# Patient Record
Sex: Female | Born: 2000 | Race: Black or African American | Hispanic: No | Marital: Single | Smoking: Never smoker
Health system: Southern US, Community
[De-identification: ages and names within clinical notes are randomized; demographics above are authoritative.]

---
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
## 2022-02-03 ENCOUNTER — Ambulatory Visit: Payer: Self-pay

## 2022-02-09 ENCOUNTER — Encounter: Payer: Self-pay | Admitting: Nurse Practitioner

## 2022-02-09 ENCOUNTER — Ambulatory Visit: Payer: Self-pay | Admitting: Nurse Practitioner

## 2022-02-09 ENCOUNTER — Ambulatory Visit (LOCAL_COMMUNITY_HEALTH_CENTER): Payer: Self-pay | Admitting: Nurse Practitioner

## 2022-02-09 VITALS — BP 141/94 | Ht 65.0 in | Wt 125.4 lb

## 2022-02-09 DIAGNOSIS — Z113 Encounter for screening for infections with a predominantly sexual mode of transmission: Secondary | ICD-10-CM

## 2022-02-09 DIAGNOSIS — Z01419 Encounter for gynecological examination (general) (routine) without abnormal findings: Secondary | ICD-10-CM

## 2022-02-09 DIAGNOSIS — Z30011 Encounter for initial prescription of contraceptive pills: Secondary | ICD-10-CM

## 2022-02-09 DIAGNOSIS — Z3009 Encounter for other general counseling and advice on contraception: Secondary | ICD-10-CM

## 2022-02-09 LAB — WET PREP FOR TRICH, YEAST, CLUE
Trichomonas Exam: NEGATIVE
Yeast Exam: NEGATIVE

## 2022-02-09 LAB — HM HIV SCREENING LAB: HM HIV Screening: NEGATIVE

## 2022-02-09 LAB — HM HEPATITIS C SCREENING LAB: HM Hepatitis Screen: NEGATIVE

## 2022-02-09 MED ORDER — LEVONORGESTREL 1.5 MG PO TABS: 1.5000 mg | ORAL_TABLET | Freq: Once | ORAL | 0 refills | Status: AC

## 2022-02-09 MED ORDER — NORGESTIM-ETH ESTRAD TRIPHASIC 0.18/0.215/0.25 MG-35 MCG PO TABS: 1.0000 | ORAL_TABLET | Freq: Every day | ORAL | 0 refills | Status: AC

## 2022-02-09 NOTE — Progress Notes (Signed)
Pt here for STD screening.  Wet mount reviewed, no treatment required per Provider.  Condoms given.  Berdie Ogren, RN

## 2022-02-09 NOTE — Progress Notes (Signed)
Pt here for PE and BC.  The patient was dispensed Tri Sprintec #3 packs and Plan B today. I provided counseling today regarding the medication. We discussed the medication, the side effects and when to call clinic. Patient given the opportunity to ask questions. Questions answered.  FP packet given.  Berdie Ogren, RN

## 2022-02-09 NOTE — Progress Notes (Signed)
[facility] Department  STI clinic/screening visit [place] [id number] [phone]  Subjective:  Alicia Ochoa is a 21 y.o. female being seen today for an STI screening visit. The patient reports they do not have symptoms.  Patient reports that they do not desire a pregnancy in the next year.   They reported they are interested in discussing contraception today.    Patient's last menstrual period was 01/13/2022 (exact date).   Patient has the following medical conditions:  There are no problems to display for this patient.   Chief Complaint  Patient presents with   SEXUALLY TRANSMITTED DISEASE    Screening- patient stated she is having some vaginal odor     HPI  Patient reports to clinic today for and STD screening.  Patient reports that she is asymptomatic.     Last HIV test per patient/review of record was: never Patient reports last pap was: never   Screening for MPX risk: Does the patient have an unexplained rash? No Is the patient MSM? No Does the patient endorse multiple sex partners or anonymous sex partners? No Did the patient have close or sexual contact with a person diagnosed with MPX? No Has the patient traveled outside the [place] where MPX is endemic? No Is there a high clinical suspicion for MPX-- evidenced by one of the following No  -Unlikely to be chickenpox  -Lymphadenopathy  -Rash that present in same phase of evolution on any given body part See flowsheet for further details and programmatic requirements.   Immunization history:  Immunization History  Administered Date(s) Administered   HPV 9-valent 01/20/2015   Hepatitis A 03/01/2007, 01/04/2011   Hepatitis B 2002/08/05, 08/07/2002, 01/17/2003   MMR 09/01/2003, 09/20/2007   Meningococcal Conjugate 01/20/2015   Meningococcal Mcv4o 03/27/2020, 03/27/2020   Pneumococcal Conjugate PCV 7 10/04/2002, 11/12/2002, 09/01/2003   Tdap 01/20/2015   Varicella 09/01/2003,  03/01/2007, 09/20/2007     The following portions of the patient's history were reviewed and updated as appropriate: allergies, current medications, past medical history, past social history, past surgical history and problem list.  Objective:  There were no vitals filed for this visit.  Physical Exam Constitutional:      Appearance: Normal appearance.  HENT:     Head: Normocephalic. No abrasion, masses or laceration. Hair is normal.     Right Ear: External ear normal.     Left Ear: External ear normal.     Nose: Nose normal.     Mouth/Throat:     Lips: Pink.     Mouth: Mucous membranes are moist. No oral lesions.     Pharynx: No oropharyngeal exudate or posterior oropharyngeal erythema.     Tonsils: No tonsillar exudate or tonsillar abscesses.     Comments: No visible signs of dental caries  Eyes:     General: Lids are normal.        Right eye: No discharge.        Left eye: No discharge.     Conjunctiva/sclera: Conjunctivae normal.     Right eye: No exudate.    Left eye: No exudate. Abdominal:     General: Abdomen is flat.     Palpations: Abdomen is soft.     Tenderness: There is no abdominal tenderness. There is no rebound.  Genitourinary:    Pubic Area: No rash or pubic lice.      Labia:        Right: No rash, tenderness, lesion or  injury.        Left: No rash, tenderness, lesion or injury.      Vagina: Normal. No vaginal discharge, erythema or lesions.     Cervix: No cervical motion tenderness, discharge, lesion or erythema.     Uterus: Not enlarged and not tender.      Rectum: Normal.     Comments: Amount Discharge: small  Odor: yes  pH: less than 4.5 Adheres to vaginal wall: No Color:color of discharge matches the Alicia Ochoa swab   Musculoskeletal:     Cervical back: Full passive range of motion without pain, normal range of motion and neck supple.  Lymphadenopathy:     Cervical: No cervical adenopathy.     Right cervical: No superficial, deep or posterior  cervical adenopathy.    Left cervical: No superficial, deep or posterior cervical adenopathy.     Upper Body:     Right upper body: No supraclavicular, axillary or epitrochlear adenopathy.     Left upper body: No supraclavicular, axillary or epitrochlear adenopathy.     Lower Body: No right inguinal adenopathy. No left inguinal adenopathy.  Skin:    General: Skin is warm and dry.     Findings: No lesion or rash.  Neurological:     Mental Status: She is alert and oriented to person, place, and time.  Psychiatric:        Attention and Perception: Attention normal.        Mood and Affect: Mood normal.        Speech: Speech normal.        Behavior: Behavior normal. Behavior is cooperative.      Assessment and Plan:  Alicia Ochoa is a 21 y.o. female presenting to the [facility] Department for STI screening  1. Screening examination for venereal disease -21 year old female in clinic today for STD screening. -Patient accepted all screenings including oral GC, vaginal CT/GC, wet prep and bloodwork for HIV/RPR.  Patient meets criteria for HepB screening? No. Ordered? No - low risk  Patient meets criteria for HepC screening? Yes. Ordered? Yes  Treat wet prep per standing order Discussed time line for [facility] Lab results and that patient will be called with positive results and encouraged patient to call if she had not heard in 2 weeks.  Counseled to return or seek care for continued or worsening symptoms Recommended condom use with all sex  Patient is currently not using  contraception  to prevent pregnancy.    - HIV/HCV [facility] - Syphilis Serology, [place] [facility] - Chlamydia/Gonorrhea [place] [facility] - WET PREP FOR [place], YEAST, CLUE - Gonococcus culture   Total time spent: 30 minutes   Return if symptoms worsen or fail to improve.    Gregary Cromer, FNP

## 2022-02-09 NOTE — Progress Notes (Signed)
[facility] DEPARTMENT [facility] [address] Main Number: [phone]    Family Planning Visit- Initial Visit  Subjective:  Alicia Ochoa is a 21 y.o.  [id number]   being seen today for an initial annual visit and to discuss reproductive life planning.  The patient is currently using No Method - Other Reason for pregnancy prevention. Patient reports   does not want a pregnancy in the next year.     report they are looking for a method that provides Method they can control starting/stopping  Patient has the following medical conditions does not have a problem list on file.  Chief Complaint  Patient presents with   Contraception    PE and BC    Patient reports to clinic today for a physical, STD screening, and to discuss birth control.     Body mass index is 20.87 kg/m. - Patient is eligible for diabetes screening based on BMI and age >60?  not applicable HA1C ordered? not applicable  Patient reports 2  partner/s in last year. Desires STI screening?  Yes  Has patient been screened once for HCV in the past?  No  No results found for: "HCVAB"  Does the patient have current drug use (including MJ), have a partner with drug use, and/or has been incarcerated since last result? Yes  If yes-- Screen for HCV through [facility]   Does the patient meet criteria for HBV testing? Yes  Criteria:  -Household, sexual or needle sharing contact with HBV -History of drug use -HIV positive -Those with known Hep C   Health Maintenance Due  Topic Date Due   HPV VACCINES (2 - 2-dose series) 07/23/2015   HIV Screening  Never done   Hepatitis C Screening  Never done   CHLAMYDIA SCREENING  05/25/2021   INFLUENZA VACCINE  Never done    Review of Systems  Constitutional:  Negative for chills, fever, malaise/fatigue and weight loss.  HENT:  Negative for congestion, hearing loss and sore throat.   Eyes:  Negative for blurred vision, double vision  and photophobia.  Respiratory:  Negative for shortness of breath.   Cardiovascular:  Negative for chest pain.  Gastrointestinal:  Negative for abdominal pain, blood in stool, constipation, diarrhea, heartburn, nausea and vomiting.  Genitourinary:  Negative for dysuria and frequency.  Musculoskeletal:  Negative for back pain, joint pain and neck pain.  Skin:  Negative for itching and rash.  Neurological:  Negative for dizziness, weakness and headaches.  Endo/Heme/Allergies:  Does not bruise/bleed easily.  Psychiatric/Behavioral:  Negative for depression, substance abuse and suicidal ideas.     The following portions of the patient's history were reviewed and updated as appropriate: allergies, current medications, past family history, past medical history, past social history, past surgical history and problem list. Problem list updated.   See flowsheet for other program required questions.  Objective:   Vitals:   02/09/22 1645 02/09/22 1650 02/09/22 1657  BP: (!) 157/99 (!) 149/97 (!) 141/94  Weight: 125 lb 6.4 oz (56.9 kg)    Height: 5\' 5"  (1.651 m)      Physical Exam Constitutional:      Appearance: Normal appearance.  HENT:     Head: Normocephalic. No abrasion, masses or laceration. Hair is normal.     Jaw: No tenderness or swelling.     Right Ear: External ear normal.     Left Ear: External ear normal.     Nose: Nose normal.  Mouth/Throat:     Lips: Pink. No lesions.     Mouth: Mucous membranes are moist. No lacerations or oral lesions.     Dentition: No dental caries.     Tongue: No lesions.     Palate: No mass and lesions.     Pharynx: No pharyngeal swelling, oropharyngeal exudate, posterior oropharyngeal erythema or uvula swelling.     Tonsils: No tonsillar exudate or tonsillar abscesses.     Comments: Dental caries noted  Eyes:     Pupils: Pupils are equal, round, and reactive to light.  Neck:     Thyroid: No thyroid mass, thyromegaly or thyroid tenderness.   Cardiovascular:     Rate and Rhythm: Normal rate and regular rhythm.  Pulmonary:     Effort: Pulmonary effort is normal.     Breath sounds: Normal breath sounds.  Abdominal:     General: Abdomen is flat. Bowel sounds are normal.     Palpations: Abdomen is soft.     Tenderness: There is no abdominal tenderness. There is no rebound.  Genitourinary:    Pubic Area: No rash or pubic lice.      Labia:        Right: No rash, tenderness or lesion.        Left: No rash, tenderness or lesion.      Vagina: Normal. No vaginal discharge, erythema, tenderness or lesions.     Cervix: No cervical motion tenderness, discharge, lesion or erythema.     Uterus: Normal.      Adnexa:        Right: No tenderness.         Left: No tenderness.       Rectum: Normal.     Comments: Amount Discharge: small  Odor: Yes pH: less than 4.5 Adheres to vaginal wall: No Color: color of discharge matches the Zuriyah Shatz swab  Musculoskeletal:     Cervical back: Full passive range of motion without pain and normal range of motion.  Lymphadenopathy:     Cervical: No cervical adenopathy.     Right cervical: No superficial, deep or posterior cervical adenopathy.    Left cervical: No superficial, deep or posterior cervical adenopathy.     Upper Body:     Right upper body: No epitrochlear adenopathy.     Left upper body: No epitrochlear adenopathy.     Lower Body: No right inguinal adenopathy. No left inguinal adenopathy.  Skin:    General: Skin is warm and dry.     Findings: No erythema, laceration, lesion or rash.  Neurological:     Mental Status: She is alert and oriented to person, place, and time.  Psychiatric:        Attention and Perception: Attention normal.        Mood and Affect: Mood normal.        Speech: Speech normal.        Behavior: Behavior normal. Behavior is cooperative.       Assessment and Plan:  Alicia Ochoa is a 21 y.o. female presenting to the [facility] Department for an  initial annual wellness/contraceptive visit  Contraception counseling: Reviewed options based on patient desire and reproductive life plan. Patient is interested in Oral Contraceptive. This was provided to the patient today.   Risks, benefits, and typical effectiveness rates were reviewed.  Questions were answered.  Written information was also given to the patient to review.    The patient will follow up in  1  years for surveillance.  The patient was told to call with any further questions, or with any concerns about this method of contraception.  Emphasized use of condoms 100% of the time for STI prevention.  Need for ECP was assessed. ECP given to patient.  1. Family planning counseling -21 year old female in clinic for a physical, STD screening, and to discuss birth control. -ROS reviewed, no complaints noted.  -Patient states she is strongly considering an IUD but would like to think about it.  Agrees to taking pills until she decides.   -Ok to given Tri-Sprintec 1 PO daily #3 Packs.  Patient to report back to clinic if she desires more pills or to continue with current method. -Plan B given to patient today.  -STD screening today.  See STD note.   - Norgestimate-Ethinyl Estradiol Triphasic (TRI-SPRINTEC) 0.18/0.215/0.25 MG-35 MCG tablet; Take 1 tablet by mouth daily.  Dispense: 28 tablet; Refill: 0 - levonorgestrel (PLAN B ONE-STEP) 1.5 MG tablet; Take 1 tablet (1.5 mg total) by mouth once for 1 dose.  Dispense: 1 tablet; Refill: 0  2. Well woman exam with routine gynecological exam -Normal well woman exam. -CBE at age 89 -PAP at age 76   Total time spent: 30 minutes   Return in about 1 year (around 02/10/2023) for Annual well-woman exam.   Glenna Fellows, FNP

## 2022-02-13 LAB — GONOCOCCUS CULTURE

## 2023-05-10 ENCOUNTER — Emergency Department
Admission: EM | Admit: 2023-05-10 | Discharge: 2023-05-10 | Disposition: A | Discharge status: Home / Self Care | Payer: Medicaid Other | Attending: Emergency Medicine | Admitting: Emergency Medicine

## 2023-05-10 ENCOUNTER — Other Ambulatory Visit: Payer: Self-pay

## 2023-05-10 DIAGNOSIS — I10 Essential (primary) hypertension: Secondary | ICD-10-CM | POA: Diagnosis present

## 2023-05-10 MED ORDER — HYDROCHLOROTHIAZIDE 12.5 MG PO TABS: 12.5000 mg | ORAL_TABLET | Freq: Every day | ORAL | 1 refills | Status: AC

## 2023-05-10 NOTE — Discharge Instructions (Addendum)
If the medication is helping, you will eventually need another provider to continue prescribing it for you.

## 2023-05-10 NOTE — ED Triage Notes (Signed)
Pt to ed from home via POV for medical clearance for BP assessment to go into the [employer] [facility] [employer]. Pt is caox4, in no acute distress and ambulatory in triage. Pt staes she has already signed forms to go but her BP was too high so they sent her here. Pt has a PCP but they refused to see her right away for this need.

## 2023-05-10 NOTE — ED Provider Notes (Signed)
[facility] Provider Note    Event Date/Time   First MD Initiated Contact with Patient 05/10/23 1324     (approximate)   History   Medical Clearance   HPI Alicia Ochoa is a 22 y.o. female presenting today for medical clearance.  Patient states she is joining [employer] but they have noted her to have high blood pressure and wanted her to come get evaluated to see if she needed to be on blood pressure medication.  She states that it has been checked several times over the past couple weeks consistently being 150-160 systolic.  Has had headaches but none recently.  Denies current vision changes, chest pain, shortness of breath.  Family history of high blood pressure at a young age.     Physical Exam   Triage Vital Signs: ED Triage Vitals [05/10/23 1307]  Encounter Vitals Group     BP (!) 164/102     Systolic BP Percentile      Diastolic BP Percentile      Pulse Rate 95     Resp 16     Temp 98.7 F (37.1 C)     Temp Source Oral     SpO2 98 %     Weight      Height      Head Circumference      Peak Flow      Pain Score 0     Pain Loc      Pain Education      Exclude from Growth Chart     Most recent vital signs: Vitals:   05/10/23 1307  BP: (!) 164/102  Pulse: 95  Resp: 16  Temp: 98.7 F (37.1 C)  SpO2: 98%   I have reviewed the vital signs. General:  Awake, alert, no acute distress. Head:  Normocephalic, Atraumatic. EENT:  PERRL, EOMI, Oral mucosa pink and moist, Neck is supple. Cardiovascular: Regular rate, 2+ distal pulses. Respiratory:  Normal respiratory effort, symmetrical expansion, no distress.   Extremities:  Moving all four extremities through full ROM without pain.   Neuro:  Alert and oriented.  Interacting appropriately.   Skin:  Warm, dry, no rash.   Psych: Appropriate affect.    ED Results / Procedures / Treatments   Labs (all labs ordered are listed, but only abnormal results are displayed) Labs Reviewed - No  data to display   EKG    RADIOLOGY    PROCEDURES:  Critical Care performed: No  Procedures   MEDICATIONS ORDERED IN ED: Medications - No data to display   IMPRESSION / MDM / ASSESSMENT AND PLAN / ED COURSE  I reviewed the triage vital signs and the nursing notes.                              Differential diagnosis includes, but is not limited to, asymptomatic hypertension  Patient's presentation is most consistent with acute, uncomplicated illness.  Patient is a 22 year old female presenting today for asymptomatic high blood pressure and medical clearance.  She brings in blood pressure readings at home with most systolic blood pressures between 150-160.  No other acute symptoms present at this time.  Given, multiple readings over the past week we will start her on blood pressure medication for further control and have [facility] with [facility] [employer] for repeat blood pressure checks to ensure improvement.  Patient agreeable to this plan and given strict return precautions.  FINAL CLINICAL IMPRESSION(S) / ED DIAGNOSES   Final diagnoses:  Primary hypertension     Rx / DC Orders   ED Discharge Orders          Ordered    hydrochlorothiazide (HYDRODIURIL) 12.5 MG tablet  Daily        05/10/23 1344             Note:  This document was prepared using Dragon voice recognition software and may include unintentional dictation errors.   Janith Lima, MD 05/10/23 1344

## 2023-05-10 NOTE — ED Triage Notes (Signed)
First nurse note: Pt to ED via POV. Pt here with paperwork from [employer] and needs to be signed by MD for BP check and reports of HA. Pt ambulatory to triage in NAD.

## 2023-05-10 NOTE — ED Notes (Signed)
See triage notes. Patient here for BP for clearance before entering the [employer] [facility] [employer]

## 2023-05-25 ENCOUNTER — Emergency Department
Admission: EM | Admit: 2023-05-25 | Discharge: 2023-05-25 | Disposition: A | Discharge status: Home / Self Care | Payer: Medicaid Other | Attending: Emergency Medicine | Admitting: Emergency Medicine

## 2023-05-25 ENCOUNTER — Other Ambulatory Visit: Payer: Self-pay

## 2023-05-25 DIAGNOSIS — I10 Essential (primary) hypertension: Secondary | ICD-10-CM | POA: Diagnosis present

## 2023-05-25 NOTE — ED Triage Notes (Signed)
Pt here needing a blood pressure check for paperwork for the [employer]. Pt denies any other symptoms.

## 2023-05-25 NOTE — ED Provider Notes (Signed)
[facility] Provider Note    Event Date/Time   First MD Initiated Contact with Patient 05/25/23 1351     (approximate)   History   Follow-up   HPI  Alicia Ochoa is a 22 y.o. female who presents today for evaluation of her vital signs.  Patient reports that she is joining [employer] and they wanted her to come get her blood pressure checked as they noticed that it was elevated.  She reports that she was started on antihypertensives during her previous visit to the emergency department and feels well on this.  She denies any headache, visual changes, chest pain, shortness of breath, or any other symptoms today.  She needs a form filled out for [employer] saying what her blood pressure is today which is why she is here today.  There are no active problems to display for this patient.         Physical Exam   Triage Vital Signs: ED Triage Vitals [05/25/23 1345]  Encounter Vitals Group     BP (!) 148/97     Systolic BP Percentile      Diastolic BP Percentile      Pulse Rate 87     Resp 16     Temp 98.6 F (37 C)     Temp Source Oral     SpO2 100 %     Weight      Height      Head Circumference      Peak Flow      Pain Score 0     Pain Loc      Pain Education      Exclude from Growth Chart     Most recent vital signs: Vitals:   05/25/23 1345 05/25/23 1403  BP: (!) 148/97 (!) 135/90  Pulse: 87 89  Resp: 16   Temp: 98.6 F (37 C)   SpO2: 100%     Physical Exam Vitals and nursing note reviewed.  Constitutional:      General: Awake and alert. No acute distress.    Appearance: Normal appearance. The patient is normal weight.  HENT:     Head: Normocephalic and atraumatic.     Mouth: Mucous membranes are moist.  Eyes:     General: PERRL. Normal EOMs        Right eye: No discharge.        Left eye: No discharge.     Conjunctiva/sclera: Conjunctivae normal.  Cardiovascular:     Rate and Rhythm: Normal rate and regular rhythm.      Pulses: Normal pulses.  Pulmonary:     Effort: Pulmonary effort is normal. No respiratory distress.     Breath sounds: Normal breath sounds.  Abdominal:     Abdomen is soft. There is no abdominal tenderness. No rebound or guarding. No distention. Musculoskeletal:        General: No swelling. Normal range of motion.     Cervical back: Normal range of motion and neck supple.  Skin:    General: Skin is warm and dry.     Capillary Refill: Capillary refill takes less than 2 seconds.     Findings: No rash.  Neurological:     Mental Status: The patient is awake and alert.      ED Results / Procedures / Treatments   Labs (all labs ordered are listed, but only abnormal results are displayed) Labs Reviewed - No data to display   EKG  RADIOLOGY     PROCEDURES:  Critical Care performed:   Procedures   MEDICATIONS ORDERED IN ED: Medications - No data to display   IMPRESSION / MDM / ASSESSMENT AND PLAN / ED COURSE  I reviewed the triage vital signs and the nursing notes.   Differential diagnosis includes, but is not limited to, asymptomatic hypertension, vital sign check, hypertension.  I reviewed the patient's chart.  Patient was seen in the emergency department on 05/10/2023 with the same complaint.  She was started on hydrochlorothiazide.  Patient is awake and alert, hemodynamically stable and afebrile.  Her blood pressure is 135/90, which patient is reassured by because she reports that it needs to be below 140 systolic.  She currently has no complaints.  No headache, visual changes, chest pain, shortness of breath, leg swelling, or any other concerns today.  She reports that she feels her normal self.  Her paperwork was filled out for her per her request.  We discussed return precautions and outpatient follow-up.  Patient understands and agrees with plan.  She was discharged in stable condition.  Patient's presentation is most consistent with acute complicated  illness / injury requiring diagnostic workup.    FINAL CLINICAL IMPRESSION(S) / ED DIAGNOSES   Final diagnoses:  Hypertension, unspecified type     Rx / DC Orders   ED Discharge Orders     None        Note:  This document was prepared using Dragon voice recognition software and may include unintentional dictation errors.   Jackelyn Hoehn, PA-C 05/25/23 1413    Janith Lima, MD 05/25/23 [phone]

## 2023-05-25 NOTE — Discharge Instructions (Addendum)
You may continue to take the medicine as prescribed previously.  Please return for any new, worsening, or change in symptoms or other concerns.  It was a pleasure caring for you today.

## 2023-06-01 ENCOUNTER — Emergency Department
Admission: EM | Admit: 2023-06-01 | Discharge: 2023-06-01 | Discharge status: Against Medical Advice | Payer: Medicaid Other
